# Patient Record
Sex: Male | Born: 1985 | Race: Black or African American | Hispanic: No | Marital: Single | Smoking: Light tobacco smoker
Health system: Southern US, Community
[De-identification: ages and names within clinical notes are randomized; demographics above are authoritative.]

## PROBLEM LIST (undated history)

## (undated) HISTORY — PX: TOE SURGERY: SHX1073

---

## 2018-12-10 ENCOUNTER — Other Ambulatory Visit: Payer: Self-pay

## 2018-12-10 ENCOUNTER — Emergency Department: Payer: Self-pay

## 2018-12-10 ENCOUNTER — Encounter: Payer: Self-pay | Admitting: Emergency Medicine

## 2018-12-10 ENCOUNTER — Emergency Department
Admission: EM | Admit: 2018-12-10 | Discharge: 2018-12-10 | Disposition: A | Discharge status: Home / Self Care | Payer: Self-pay | Attending: Emergency Medicine | Admitting: Emergency Medicine

## 2018-12-10 DIAGNOSIS — F17210 Nicotine dependence, cigarettes, uncomplicated: Secondary | ICD-10-CM | POA: Insufficient documentation

## 2018-12-10 DIAGNOSIS — R079 Chest pain, unspecified: Secondary | ICD-10-CM | POA: Insufficient documentation

## 2018-12-10 LAB — BASIC METABOLIC PANEL
Anion gap: 8 (ref 5–15)
BUN: 10 mg/dL (ref 6–20)
CO2: 27 mmol/L (ref 22–32)
Calcium: 9 mg/dL (ref 8.9–10.3)
Chloride: 105 mmol/L (ref 98–111)
Creatinine, Ser: 0.82 mg/dL (ref 0.61–1.24)
GFR calc Af Amer: >60 mL/min (ref >60)
GFR calc non Af Amer: >60 mL/min (ref >60)
Glucose, Bld: 107 mg/dL — ABNORMAL HIGH (ref 70–99)
Potassium: 3.8 mmol/L (ref 3.5–5.1)
Sodium: 140 mmol/L (ref 135–145)

## 2018-12-10 LAB — CBC
HCT: 40.7 % (ref 39.0–52.0)
Hemoglobin: 12.1 g/dL — ABNORMAL LOW (ref 13.0–17.0)
MCH: 22.5 pg — ABNORMAL LOW (ref 26.0–34.0)
MCHC: 29.7 g/dL — ABNORMAL LOW (ref 30.0–36.0)
MCV: 75.8 fL — ABNORMAL LOW (ref 80.0–100.0)
Platelets: 274 10*3/uL (ref 150–400)
RBC: 5.37 MIL/uL (ref 4.22–5.81)
RDW: 14.3 % (ref 11.5–15.5)
WBC: 4 10*3/uL (ref 4.0–10.5)
nRBC: 0 % (ref 0.0–0.2)

## 2018-12-10 LAB — TROPONIN I: Troponin I: <0.03 ng/mL (ref <0.03)

## 2018-12-10 MED ORDER — MELOXICAM 15 MG PO TABS: 15.0000 mg | ORAL_TABLET | Freq: Every day | ORAL | 0 refills | Status: AC

## 2018-12-10 MED ORDER — KETOROLAC TROMETHAMINE 30 MG/ML IJ SOLN
30.0000 mg | Freq: Once | INTRAMUSCULAR | Status: AC
  Administered 2018-12-10: 30 mg via INTRAVENOUS
  Filled 2018-12-10: qty 1

## 2018-12-10 NOTE — Discharge Instructions (Signed)
Please establish a primary care provider and follow up if symptoms are not improving over the next few days.  Return to the ER for symptoms that change or worsen.

## 2018-12-10 NOTE — ED Triage Notes (Signed)
PT arrives after being sent by [facility] for abnormal EKG. Pt states he went to [facility] due to chest pain in the center of his chest that started Saturday. Pt reports the pain as a intermittent pressure.

## 2018-12-10 NOTE — ED Provider Notes (Signed)
[facility] Emergency Department Provider Note  ____________________________________________   First MD Initiated Contact with Patient 12/10/18 1544     (approximate)  I have reviewed the triage vital signs and the nursing notes.   HISTORY  Chief Complaint Chest Pain   HPI Timothy Hill is a 33 y.o. male who presents to the emergency department for treatment and evaluation of midsternal chest pain that started on Saturday. Pain is worse with deep breath. No previous similar symptoms. He smokes about a pack of cigarettes per week. No history of asthma or respiratory issues. No recent URI symptoms or fever. No recent travel or known sick contacts. He was initially evaluated at [facility] and sent to the ER for further evaluation. While there, he was given Advil. He hasn't noticed any improvement.   History reviewed. No pertinent past medical history.  There are no active problems to display for this patient.   Past Surgical History:  Procedure Laterality Date  . TOE SURGERY      Prior to Admission medications   Medication Sig Start Date End Date Taking? Authorizing Provider  meloxicam (MOBIC) 15 MG tablet Take 1 tablet (15 mg total) by mouth daily. 12/10/18   Chinita Pester, FNP    Allergies Patient has no allergy information on record.  No family history on file.  Social History Social History   Tobacco Use  . Smoking status: Light Tobacco Smoker  . Smokeless tobacco: Never Used  Substance Use Topics  . Alcohol use: Yes  . Drug use: Not on file    Review of Systems  Constitutional: No fever/chills. Eyes: No visual changes. ENT: No sore throat. Cardiovascular: Positive for chest pressure. Positive for pleuritic pain. Negative for palpitations. Negative for leg pain. Respiratory: Negative for shortness of breath. Gastrointestinal: Negative for abdominal pain. Negative for nausea, negative vomiting.  No diarrhea.  No constipation.  Genitourinary: Negative for dysuria. Musculoskeletal: Negative for back pain.  Skin: Negative for rash, lesion, wound. Neurological: Negative for headaches, focal weakness or numbness.  ____________________________________________   PHYSICAL EXAM:  VITAL SIGNS: ED Triage Vitals  Enc Vitals Group     BP 12/10/18 1534 (!) 164/83     Pulse Rate 12/10/18 1533 85     Resp 12/10/18 1533 15     Temp 12/10/18 1533 97.8 F (36.6 C)     Temp Source 12/10/18 1533 Oral     SpO2 12/10/18 1533 96 %     Weight 12/10/18 1532 227 lb (103 kg)     Height 12/10/18 1532 6\' 4"  (1.93 m)     Head Circumference --      Peak Flow --      Pain Score 12/10/18 1532 6     Pain Loc --      Pain Edu? --      Excl. in GC? --     Constitutional: Alert and oriented. Well appearing and in no acute distress. Normal mental status. Eyes: Conjunctivae are normal. PERRL. Head: Atraumatic. Nose: No congestion/rhinnorhea. Mouth/Throat: Mucous membranes are moist.  Oropharynx non-erythematous. Tongue normal in size and color. Neck: No stridor. No carotid bruit appreciated on exam. Hematological/Lymphatic/Immunilogical: No cervical lymphadenopathy. Cardiovascular: Normal rate, regular rhythm. Grossly normal heart sounds.  Good peripheral circulation. Respiratory: Normal respiratory effort.  No retractions. Lungs CTAB. Gastrointestinal: Soft and nontender. No distention. No abdominal bruits. No CVA tenderness. Genitourinary: Exam deferred. Musculoskeletal: No lower extremity tenderness. No edema of extremities. Neurologic:  Normal speech and language. No  gross focal neurologic deficits are appreciated. Skin:  Skin is warm, dry and intact. No rash noted. Psychiatric: Mood and affect are normal. Speech and behavior are normal.  ____________________________________________   LABS (all labs ordered are listed, but only abnormal results are displayed)  Labs Reviewed  BASIC METABOLIC PANEL - Abnormal; Notable for  the following components:      Result Value   Glucose, Bld 107 (*)    All other components within normal limits  CBC - Abnormal; Notable for the following components:   Hemoglobin 12.1 (*)    MCV 75.8 (*)    MCH 22.5 (*)    MCHC 29.7 (*)    All other components within normal limits  TROPONIN I   ____________________________________________  EKG  ED ECG REPORT I, Caleigha Zale, FNP-BC personally viewed and interpreted this ECG.   Date: 12/10/2018  EKG Time: 1532  Rate: 78  Rhythm: normal EKG, normal sinus rhythm  Axis: Normal  Intervals:none  ST&T Change: No ST elevation.  ____________________________________________  RADIOLOGY  ED MD interpretation:  No acute cardiopulmonary abnormality per radiology.  Official radiology report(s): Dg Chest 2 View  Result Date: 12/10/2018 CLINICAL DATA:  Chest pain. EXAM: CHEST - 2 VIEW COMPARISON:  None. FINDINGS: The heart size and mediastinal contours are within normal limits. Both lungs are clear. No pneumothorax or pleural effusion is noted. The visualized skeletal structures are unremarkable. IMPRESSION: No active cardiopulmonary disease. Electronically Signed   By: Lupita Raider, M.D.   On: 12/10/2018 15:56    ____________________________________________   PROCEDURES  Procedure(s) performed: None  Procedures  Critical Care performed: No  ____________________________________________   INITIAL IMPRESSION / ASSESSMENT AND PLAN / ED COURSE  As part of my medical decision making, I reviewed the following data within the electronic MEDICAL RECORD NUMBER Evaluated by EM attending Dr. Derrill Kay  33 year old male presents to the ER for evaluation of mid-sternal chest pressure that has been present for 5 days. He has had no URI or GI symptoms and denies fever. No recent travel or sick contacts. He had presented to [facility] who did an EKG. Computer read ST elevation and he was sent here for further evaluation. EKG performed here shows  no STEMI. Patient is well appearing.   ----------------------------------------- 4:57 PM on 12/10/2018 -----------------------------------------  After toradol, patient denied pain. He states that he feels that he may have "tweaked it." He was encouraged to establish a primary care provider and follow up if not improving over the next few days. He was also advised to return to the ER for symptoms that change or worsen if unable to schedule an appointment.     ____________________________________________   FINAL CLINICAL IMPRESSION(S) / ED DIAGNOSES  Final diagnoses:  Nonspecific chest pain     ED Discharge Orders         Ordered    meloxicam (MOBIC) 15 MG tablet  Daily     12/10/18 1651           Note:  This document was prepared using Dragon voice recognition software and may include unintentional dictation errors.    Chinita Pester, FNP 12/10/18 1659    Phineas Semen, MD 12/10/18 [phone]

## 2018-12-10 NOTE — ED Notes (Signed)
Patient transported to X-ray 

## 2018-12-10 NOTE — ED Provider Notes (Signed)
Lurline Idol, attending physician, personally viewed and interpreted this EKG  EKG Time: 1532 Rate: 78 Rhythm: normal sinus rhythm Axis: normal Intervals: qtc 405 QRS: narrow ST changes: no st elevation Impression: normal ekg    Phineas Semen, MD 12/10/18 1656

## 2020-11-20 IMAGING — CR CHEST - 2 VIEW
2 series · 2 of 2 positions shown · non-contrast
Comparison: None.

CLINICAL DATA: Chest pain.

EXAM:
CHEST - 2 VIEW

[chest pa]
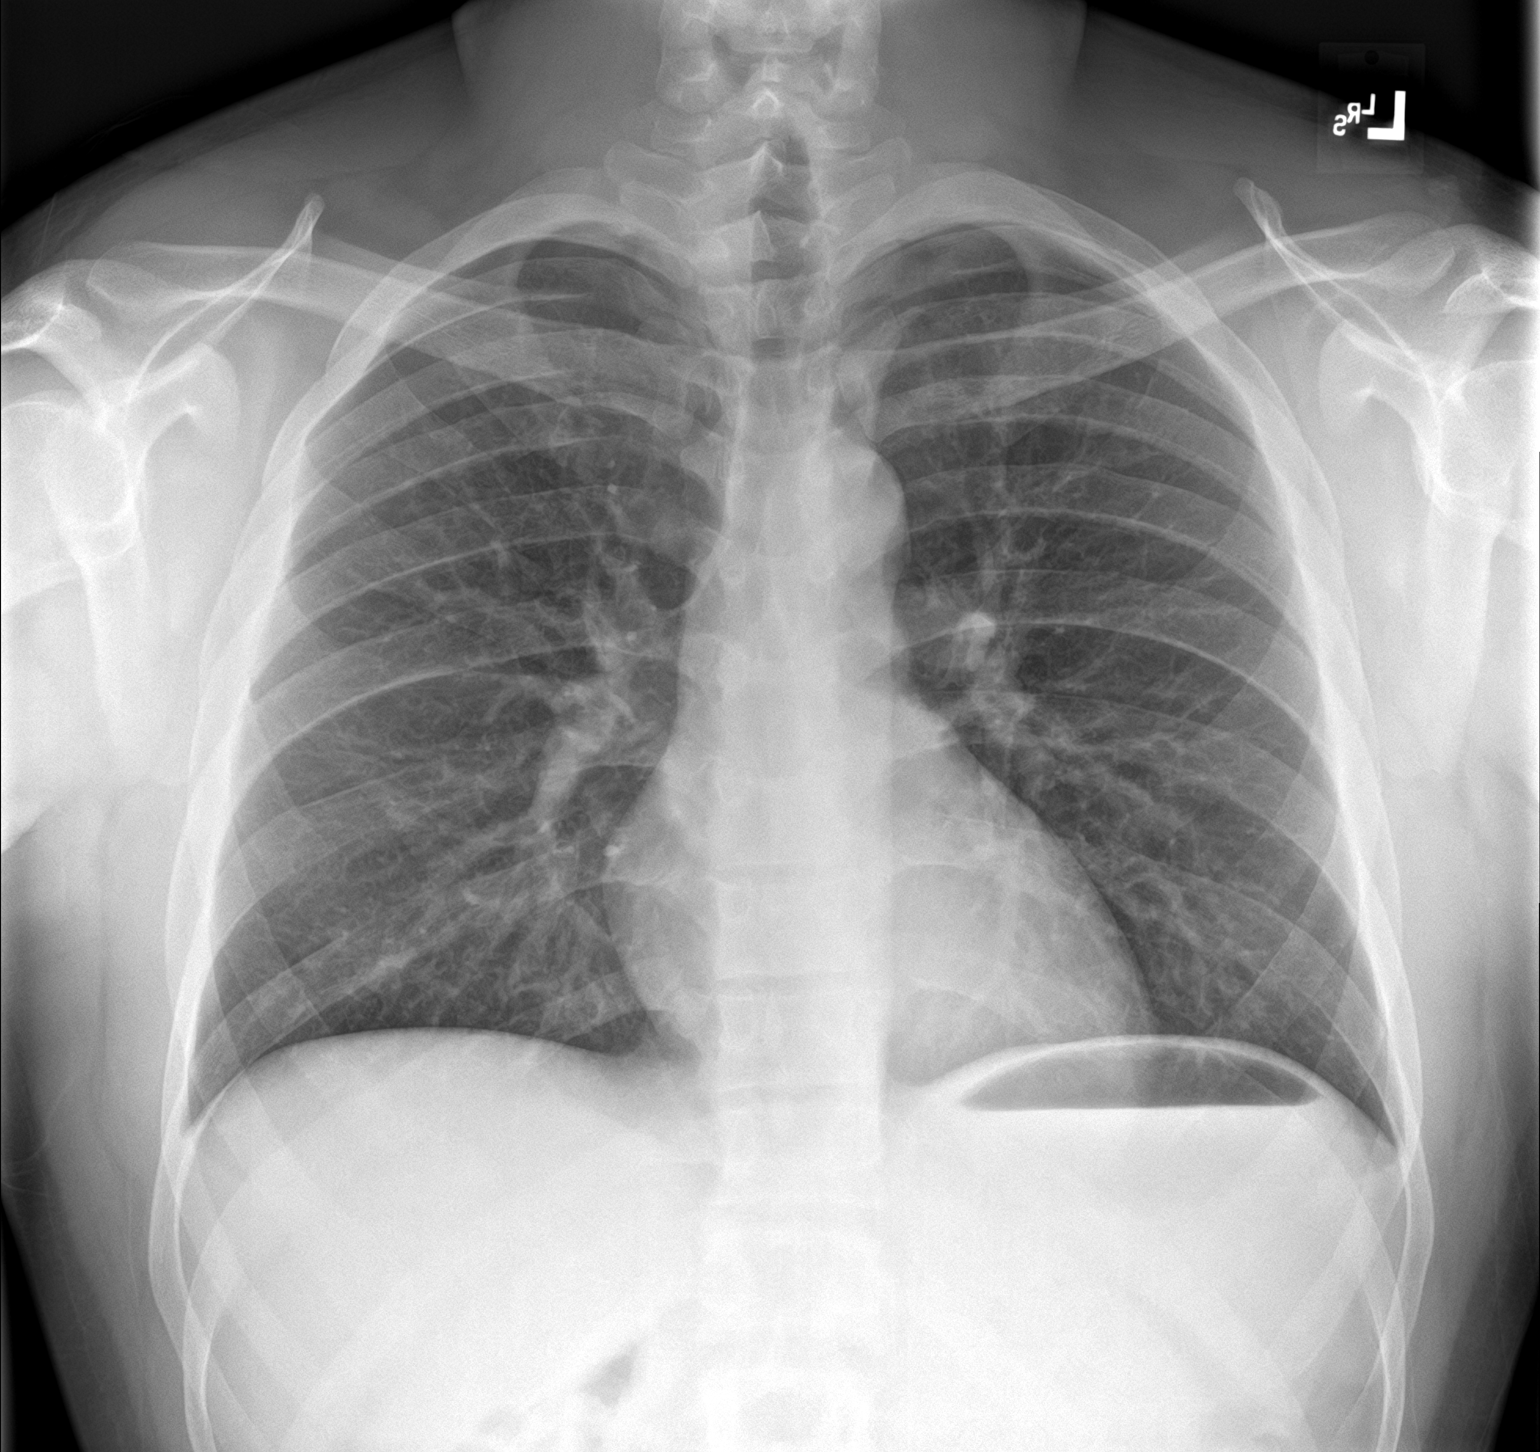

[chest lat]
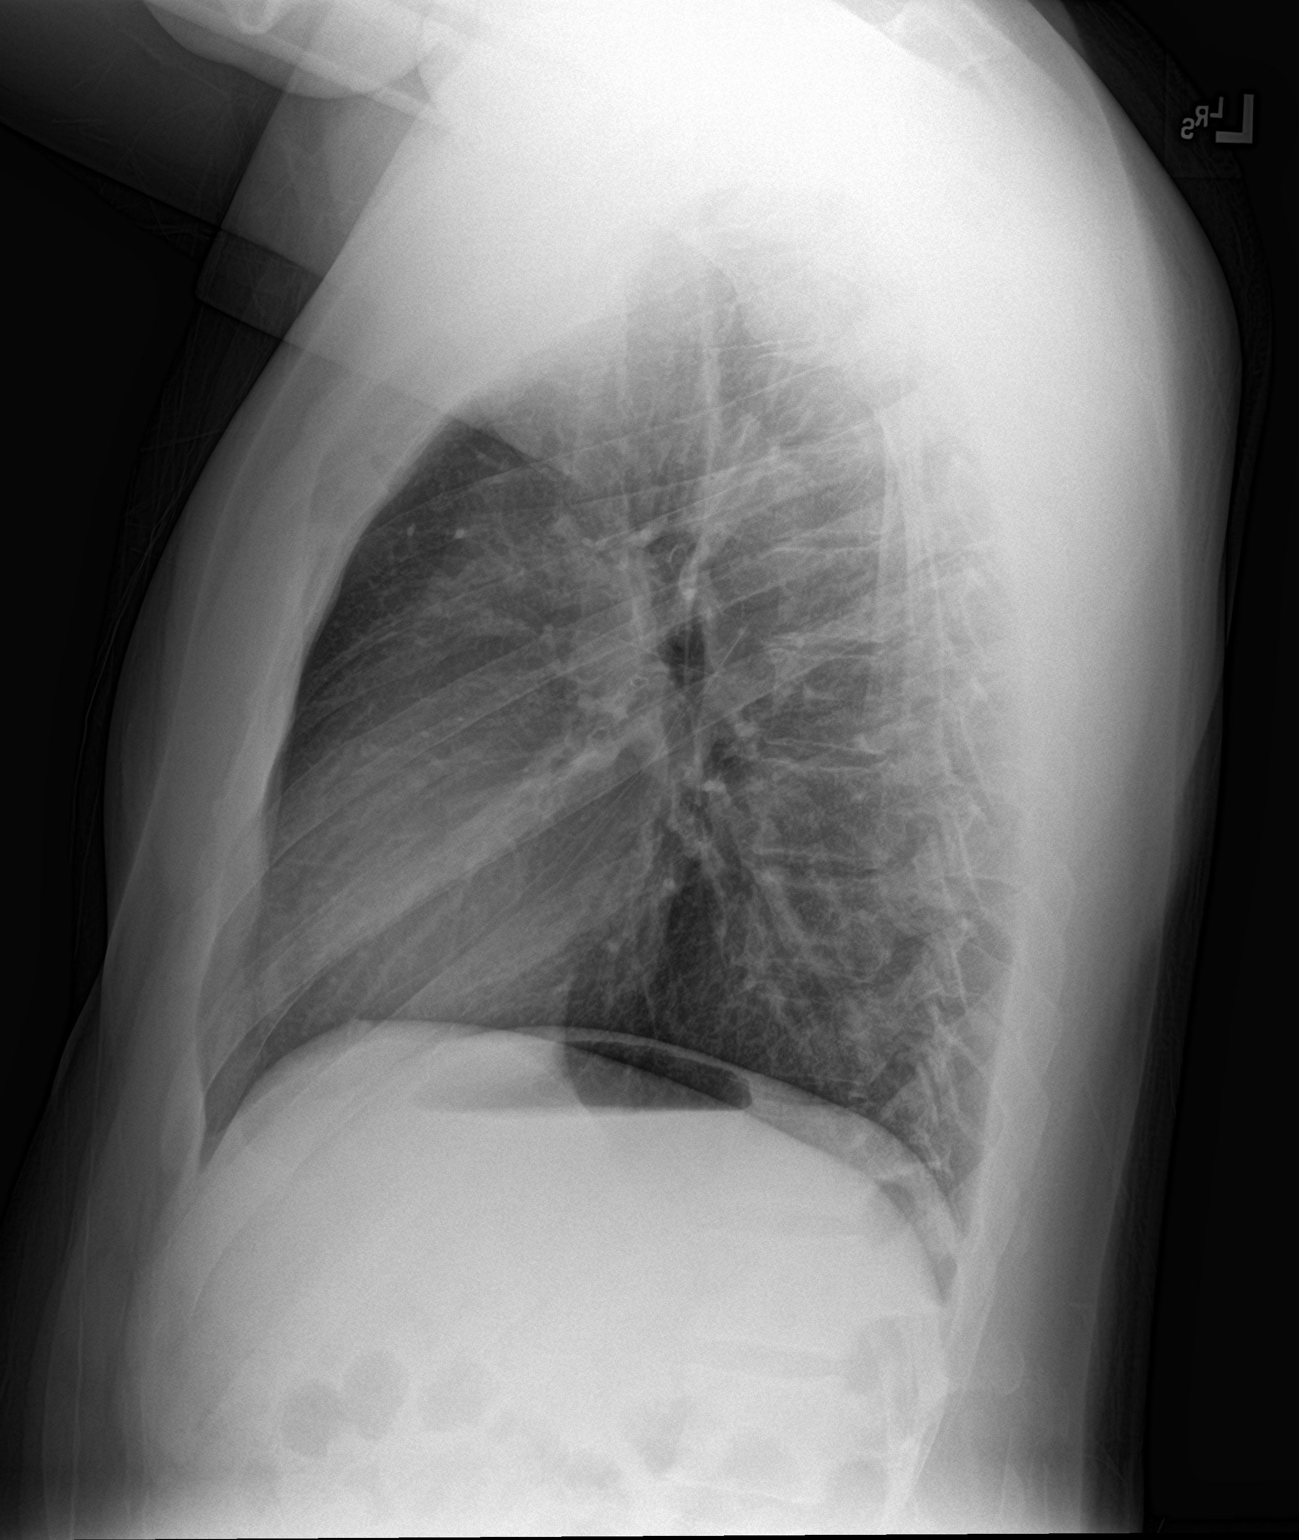

[2 of 2 positions shown; findings below may reference images not displayed]

FINDINGS: The heart size and mediastinal contours are within normal limits.
Both lungs are clear. No pneumothorax or pleural effusion is noted.
The visualized skeletal structures are unremarkable.
IMPRESSION: No active cardiopulmonary disease.
